# Patient Record
Sex: Male | Born: 1995 | Race: White | Hispanic: No | Marital: Single
Health system: Midwestern US, Community
[De-identification: ages and names within clinical notes are randomized; demographics above are authoritative.]

---
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
## 2013-09-25 MED ORDER — NAPROXEN 500 MG TAB
500 mg | ORAL_TABLET | Freq: Two times a day (BID) | ORAL | Status: AC
Start: 2013-09-25 — End: 2013-10-05

## 2013-09-25 MED ORDER — HYDROCODONE-ACETAMINOPHEN 5 MG-325 MG TAB
5-325 mg | ORAL_TABLET | ORAL | Status: AC | PRN
Start: 2013-09-25 — End: ?

## 2013-09-25 MED ADMIN — HYDROcodone-acetaminophen (NORCO) 5-325 mg per tablet 1 Tab: ORAL | NDC 00591320205

## 2013-09-25 MED FILL — HYDROCODONE-ACETAMINOPHEN 5 MG-325 MG TAB: 5-325 mg | ORAL | Qty: 1

## 2013-09-25 NOTE — ED Notes (Signed)
Ice applied to right shoulder

## 2013-09-25 NOTE — ED Notes (Signed)
Patient discharged by provider.  Ambulatory out of department with family in NAD.

## 2013-09-25 NOTE — ED Provider Notes (Signed)
HPI Comments: Wayne Martin is a 18 y.o. male who presents ambulatory to the ED with a c/o right shoulder pain since dislocating it falling down a hill at 9am today. Pt notes he works in [employer]. When he fell, he "went one way and my arm went the other." He states he leaned against a fence and popped it back in as he has popped it out several times before. [facility] and was sent to the ED after xrays were taken. Per family, [facility] did not explain their findings. He denies other injury or meds pta.   He specifically denies any fevers, chills, nausea, vomiting, chest pain, shortness of breath, headache, rash, diarrhea, sweating or weight loss.      PCP: Collene SchlichterGeorge  Puster, MD  PMHx significant for: Past Medical History:    CP (cerebral palsy) (HCC)                                   PSHx significant for: History reviewed. No pertinent surgical history.  Social Hx: Tobacco: denies EtOH: social Illicit drug use: denies    There are no further complaints or symptoms at this time.            Past Medical History   Diagnosis Date   ??? CP (cerebral palsy) (HCC)         No past surgical history on file.      No family history on file.     History     Social History   ??? Marital Status: SINGLE     Spouse Name: N/A     Number of Children: N/A   ??? Years of Education: N/A     Occupational History   ??? Not on file.     Social History Main Topics   ??? Smoking status: Never Smoker    ??? Smokeless tobacco: Not on file   ??? Alcohol Use: No   ??? Drug Use: No   ??? Sexual Activity: Not on file     Other Topics Concern   ??? Not on file     Social History Narrative   ??? No narrative on file                  ALLERGIES: Review of patient's allergies indicates no known allergies.      Review of Systems   Constitutional: Negative for fever and chills.   HENT: Negative for congestion, rhinorrhea, sneezing and sore throat.    Eyes: Negative for redness and visual disturbance.   Respiratory: Negative for shortness of breath.     Cardiovascular: Negative for chest pain and leg swelling.   Gastrointestinal: Negative for nausea, vomiting and abdominal pain.   Genitourinary: Negative for frequency and difficulty urinating.   Musculoskeletal: Positive for joint swelling and arthralgias. Negative for myalgias, back pain and neck stiffness.   Skin: Negative for rash.   Neurological: Negative for dizziness, syncope, weakness and headaches.   Hematological: Negative for adenopathy.       Filed Vitals:    09/25/13 1733   BP: 118/61   Pulse: 50   Temp: 98.5 ??F (36.9 ??C)   Resp: 16   Height: 6\' 8"  (2.032 m)   Weight: 97.523 kg (215 lb)   SpO2: 98%            Physical Exam   Constitutional: He is oriented to  person, place, and time. He appears well-developed and well-nourished. No distress.   HENT:   Head: Normocephalic and atraumatic.   Right Ear: External ear normal.   Left Ear: External ear normal.   Eyes: EOM are normal. Pupils are equal, round, and reactive to light.   Neck: Normal range of motion.   Cardiovascular: Normal rate, regular rhythm, normal heart sounds and intact distal pulses.  Exam reveals no friction rub.    No murmur heard.  Pulmonary/Chest: Effort normal and breath sounds normal. No stridor. No respiratory distress. He has no wheezes. He has no rales. He exhibits no tenderness.   Musculoskeletal: Normal range of motion. He exhibits tenderness. He exhibits no edema.   Right shoulder diffusely TTP - more focal anteriorly with slight swelling. No discoloration. No deformity. Distal n/v intact. Cap refill. ROM limited by discomfort. Normothermic.   Neurological: He is alert and oriented to person, place, and time. He exhibits normal muscle tone. Coordination normal.   Skin: Skin is warm and dry. No rash noted. He is not diaphoretic. No erythema. No pallor.   Psychiatric: He has a normal mood and affect. His behavior is normal.   Nursing note and vitals reviewed.       MDM  Number of Diagnoses or Management Options     Amount and/or  Complexity of Data Reviewed  Tests in the radiology section of CPT??: ordered and reviewed  Obtain history from someone other than the patient: yes (mother)  Review and summarize past medical records: yes  Independent visualization of images, tracings, or specimens: yes    Patient Progress  Patient progress: improved      Procedures    IMAGING COMPLETED AND REVIEWED:  XR SHOULDER RT AP/LAT MIN 2 V (Final result) Result time: 09/25/13 19:38:14    Final result by Rad Results In Edi (09/25/13 19:38:14)    Narrative:    **Final Report**      ICD Codes / Adm.Diagnosis: 140014 / Shoulder Pain shoulder dislocation   (right)/w  Examination: CR SHOULDER MIN 2 VWS RT - [id number] - Sep 25 2013 7:26PM  Accession No: [id number]  Reason: shoulder injury      REPORT:  EXAM: CR SHOULDER MIN 2 VWS RT    INDICATION: Trauma    COMPARISON: None.    FINDINGS: Three views of the right shoulder demonstrate no fracture,   dislocation or other acute abnormality.       IMPRESSION: No acute abnormality.          Signing/Reading Doctor: Ann LionsJEAN M. DUFOUR [phone]   Approved: Ann LionsJEAN M. DUFOUR [phone]) Sep 25 2013 7:36PM        MEDICATIONS GIVEN:  Medications   HYDROcodone-acetaminophen (NORCO) 5-325 mg per tablet 1 Tab (1 Tab Oral Given 09/25/13 1936)       CLINICAL IMPRESSION:  No diagnosis found.      Plan  1.  2.  3.  Return to ED if sx's worsen    DISCHARGE NOTE:  7:50 PM  Freddie BreechJanice K. Quick, PA-C spoke with Wayne Martin about sx, dx, tx, and rx with good understanding. Care plan outlined and precautions discussed. Pt has been re-examined. Pt is feeling better. There are no new complaints, changes, or physical findings. Reviewed results of lradiology with pt.  Medications given in ED and prescription medications were discussed with pt. All pt???s questions and concerns were addressed.  Pt was instructed and agrees to f/u with ortho, as well as to return to ED upon  further deterioration. Pt is ready to go home.

## 2013-09-25 NOTE — ED Notes (Signed)
Dr. Hughes at b/s to re-evaluate pt and discuss plan of care.

## 2013-09-25 NOTE — ED Notes (Signed)
Assumed pt care. Pt in radiology. Mother in room.

## 2013-09-25 NOTE — ED Notes (Addendum)
Patient c/o "slipping and falling down a hill at work". States "shoulder is out of joint". Hx of the same. +PMS

## 2019-02-24 ENCOUNTER — Emergency Department (HOSPITAL_COMMUNITY)
Admission: EM | Admit: 2019-02-24 | Discharge: 2019-02-24 | Disposition: A | Discharge status: Home / Self Care | Payer: Managed Care, Other (non HMO) | Attending: Emergency Medicine | Admitting: Emergency Medicine

## 2019-02-24 ENCOUNTER — Encounter (HOSPITAL_COMMUNITY): Payer: Self-pay | Admitting: Emergency Medicine

## 2019-02-24 ENCOUNTER — Emergency Department (HOSPITAL_COMMUNITY): Payer: Managed Care, Other (non HMO)

## 2019-02-24 DIAGNOSIS — F10920 Alcohol use, unspecified with intoxication, uncomplicated: Secondary | ICD-10-CM

## 2019-02-24 DIAGNOSIS — R404 Transient alteration of awareness: Secondary | ICD-10-CM

## 2019-02-24 DIAGNOSIS — R569 Unspecified convulsions: Secondary | ICD-10-CM | POA: Diagnosis present

## 2019-02-24 DIAGNOSIS — R4689 Other symptoms and signs involving appearance and behavior: Secondary | ICD-10-CM

## 2019-02-24 LAB — URINALYSIS, ROUTINE W REFLEX MICROSCOPIC
Bilirubin Urine: NEGATIVE
Glucose, UA: NEGATIVE mg/dL
Hgb urine dipstick: NEGATIVE
Ketones, ur: NEGATIVE mg/dL
Leukocytes,Ua: NEGATIVE
Nitrite: NEGATIVE
Protein, ur: NEGATIVE mg/dL
Specific Gravity, Urine: 1.009 (ref 1.005–1.030)
pH: 5 (ref 5.0–8.0)

## 2019-02-24 LAB — COMPREHENSIVE METABOLIC PANEL
ALT: 60 U/L — ABNORMAL HIGH (ref 0–44)
AST: 26 U/L (ref 15–41)
Albumin: 4.4 g/dL (ref 3.5–5.0)
Alkaline Phosphatase: 49 U/L (ref 38–126)
Anion gap: 14 (ref 5–15)
BUN: 12 mg/dL (ref 6–20)
CO2: 20 mmol/L — ABNORMAL LOW (ref 22–32)
Calcium: 8.9 mg/dL (ref 8.9–10.3)
Chloride: 106 mmol/L (ref 98–111)
Creatinine, Ser: 1.11 mg/dL (ref 0.61–1.24)
GFR calc Af Amer: >60 mL/min (ref >60)
GFR calc non Af Amer: >60 mL/min (ref >60)
Glucose, Bld: 88 mg/dL (ref 70–99)
Potassium: 3.8 mmol/L (ref 3.5–5.1)
Sodium: 140 mmol/L (ref 135–145)
Total Bilirubin: 0.8 mg/dL (ref 0.3–1.2)
Total Protein: 7.2 g/dL (ref 6.5–8.1)

## 2019-02-24 LAB — CBC WITH DIFFERENTIAL/PLATELET
Abs Immature Granulocytes: 0.02 10*3/uL (ref 0.00–0.07)
Basophils Absolute: 0.1 10*3/uL (ref 0.0–0.1)
Basophils Relative: 1 %
Eosinophils Absolute: 0.1 10*3/uL (ref 0.0–0.5)
Eosinophils Relative: 2 %
HCT: 46 % (ref 39.0–52.0)
Hemoglobin: 16 g/dL (ref 13.0–17.0)
Immature Granulocytes: 0 %
Lymphocytes Relative: 27 %
Lymphs Abs: 1.6 10*3/uL (ref 0.7–4.0)
MCH: 29.2 pg (ref 26.0–34.0)
MCHC: 34.8 g/dL (ref 30.0–36.0)
MCV: 83.9 fL (ref 80.0–100.0)
Monocytes Absolute: 0.4 10*3/uL (ref 0.1–1.0)
Monocytes Relative: 7 %
Neutro Abs: 3.8 10*3/uL (ref 1.7–7.7)
Neutrophils Relative %: 63 %
Platelets: 250 10*3/uL (ref 150–400)
RBC: 5.48 MIL/uL (ref 4.22–5.81)
RDW: 12.1 % (ref 11.5–15.5)
WBC: 6.1 10*3/uL (ref 4.0–10.5)
nRBC: 0 % (ref 0.0–0.2)

## 2019-02-24 LAB — POCT I-STAT EG7
Acid-base deficit: 5 mmol/L — ABNORMAL HIGH (ref 0.0–2.0)
Bicarbonate: 21.5 mmol/L (ref 20.0–28.0)
Calcium, Ion: 1.11 mmol/L — ABNORMAL LOW (ref 1.15–1.40)
HCT: 46 % (ref 39.0–52.0)
Hemoglobin: 15.6 g/dL (ref 13.0–17.0)
O2 Saturation: 99 %
Potassium: 3.8 mmol/L (ref 3.5–5.1)
Sodium: 142 mmol/L (ref 135–145)
TCO2: 23 mmol/L (ref 22–32)
pCO2, Ven: 43.3 mmHg — ABNORMAL LOW (ref 44.0–60.0)
pH, Ven: 7.305 (ref 7.250–7.430)
pO2, Ven: 148 mmHg — ABNORMAL HIGH (ref 32.0–45.0)

## 2019-02-24 LAB — PROTIME-INR
INR: 1 (ref 0.8–1.2)
Prothrombin Time: 13.1 seconds (ref 11.4–15.2)

## 2019-02-24 LAB — ACETAMINOPHEN LEVEL: Acetaminophen (Tylenol), Serum: <10 ug/mL — ABNORMAL LOW (ref 10–30)

## 2019-02-24 LAB — CK: Total CK: 168 U/L (ref 49–397)

## 2019-02-24 LAB — RAPID URINE DRUG SCREEN, HOSP PERFORMED
Amphetamines: NOT DETECTED
Barbiturates: NOT DETECTED
Benzodiazepines: POSITIVE — AB
Cocaine: NOT DETECTED
Opiates: NOT DETECTED
Tetrahydrocannabinol: NOT DETECTED

## 2019-02-24 LAB — APTT: aPTT: 28 seconds (ref 24–36)

## 2019-02-24 LAB — LACTIC ACID, PLASMA: Lactic Acid, Venous: 3.1 mmol/L (ref 0.5–1.9)

## 2019-02-24 LAB — ETHANOL: Alcohol, Ethyl (B): 240 mg/dL — ABNORMAL HIGH (ref <10)

## 2019-02-24 LAB — SALICYLATE LEVEL: Salicylate Lvl: <7 mg/dL (ref 2.8–30.0)

## 2019-02-24 MED ORDER — SODIUM CHLORIDE 0.9 % IV BOLUS
1000.0000 mL | Freq: Once | INTRAVENOUS | Status: AC
  Administered 2019-02-24: 1000 mL via INTRAVENOUS

## 2019-02-24 MED ORDER — DIPHENHYDRAMINE HCL 50 MG/ML IJ SOLN
50.0000 mg | Freq: Once | INTRAMUSCULAR | Status: AC
  Administered 2019-02-24: 50 mg via INTRAVENOUS

## 2019-02-24 MED ORDER — LORAZEPAM 2 MG/ML IJ SOLN
INTRAMUSCULAR | Status: AC
  Administered 2019-02-24: 2 mg
  Filled 2019-02-24: qty 1

## 2019-02-24 MED ORDER — MIDAZOLAM HCL 2 MG/2ML IJ SOLN
4.0000 mg | Freq: Once | INTRAMUSCULAR | Status: AC
  Administered 2019-02-24: 4 mg via INTRAVENOUS

## 2019-02-24 MED ORDER — LORAZEPAM 2 MG/ML IJ SOLN
2.0000 mg | Freq: Once | INTRAMUSCULAR | Status: AC
  Administered 2019-02-24: 2 mg via INTRAVENOUS

## 2019-02-24 MED ORDER — DROPERIDOL 2.5 MG/ML IJ SOLN
10.0000 mg | Freq: Once | INTRAMUSCULAR | Status: AC
  Administered 2019-02-24: 10 mg via INTRAVENOUS
  Filled 2019-02-24: qty 4

## 2019-02-24 NOTE — ED Notes (Signed)
All restraints removed. Pt continues to be calm, cooperative. Girlfriend at bedside, this RN updated her on POC. Sitter remains at bedside

## 2019-02-24 NOTE — ED Notes (Signed)
Returned from ct- sleeping at present

## 2019-02-24 NOTE — ED Notes (Signed)
Danny from [facility] called to attempt to TTS pt-- pt is still sleeping, will attempt to see on night shift.

## 2019-02-24 NOTE — ED Provider Notes (Signed)
[facility] EMERGENCY DEPARTMENT Provider Note   CSN: [id number] Arrival date & time: 02/24/19  1442     History   Chief Complaint Chief Complaint  Patient presents with  . Seizures  . Aggressive Behavior    HPI Troy Meyer is a 23 y.o. male.     23 yo M with a cc of ams.  Found in the middle of the street, confused and it was presumed that he had had a seizure.  After which the patient became very combative and was fighting EMS and the police.  Was given 2 mg of Versed with some improvement however the patient continued to be combative.  Level 5 caveat altered mental status.  The history is provided by the patient.  Seizures Illness Severity:  Moderate Onset quality:  Gradual Duration:  2 hours Timing:  Constant Progression:  Worsening Chronicity:  New Associated symptoms: no abdominal pain, no chest pain, no congestion, no diarrhea, no fever, no headaches, no myalgias, no rash, no shortness of breath and no vomiting     History reviewed. No pertinent past medical history.  There are no active problems to display for this patient.   History reviewed. No pertinent surgical history.      Home Medications    Prior to Admission medications   Not on File    Family History No family history on file.  Social History Social History   Tobacco Use  . Smoking status: Not on file  Substance Use Topics  . Alcohol use: Not on file  . Drug use: Not on file     Allergies   Patient has no allergy information on record.   Review of Systems Review of Systems  Unable to perform ROS: Mental status change  Constitutional: Positive for activity change. Negative for chills and fever.  HENT: Negative for congestion and facial swelling.   Eyes: Negative for discharge and visual disturbance.  Respiratory: Negative for shortness of breath.   Cardiovascular: Negative for chest pain and palpitations.  Gastrointestinal: Negative for abdominal  pain, diarrhea and vomiting.  Musculoskeletal: Negative for arthralgias and myalgias.  Skin: Negative for color change and rash.  Neurological: Positive for seizures. Negative for tremors, syncope and headaches.  Psychiatric/Behavioral: Negative for confusion and dysphoric mood.     Physical Exam Updated Vital Signs BP (!) 123/58 (BP Location: Right Arm)   Pulse 96   Temp 98.2 F (36.8 C) (Temporal)   Resp 20   Ht 6\' 3"  (1.905 m)   Wt 127 kg   SpO2 100%   BMI 35.00 kg/m   Physical Exam Vitals signs and nursing note reviewed.  Constitutional:      Appearance: He is well-developed.  HENT:     Head: Normocephalic and atraumatic.  Eyes:     Pupils: Pupils are equal, round, and reactive to light.  Neck:     Musculoskeletal: Normal range of motion and neck supple.     Vascular: No JVD.  Cardiovascular:     Rate and Rhythm: Normal rate and regular rhythm.     Heart sounds: No murmur. No friction rub. No gallop.   Pulmonary:     Effort: No respiratory distress.     Breath sounds: No wheezing.  Abdominal:     General: There is no distension.     Tenderness: There is no abdominal tenderness. There is no guarding or rebound.  Musculoskeletal: Normal range of motion.  Skin:    Coloration: Skin is not pale.  Findings: No rash.  Neurological:     Mental Status: He is alert.  Psychiatric:     Comments: combative      ED Treatments / Results  Labs (all labs ordered are listed, but only abnormal results are displayed) Labs Reviewed  POCT I-STAT EG7 - Abnormal; Notable for the following components:      Result Value   pCO2, Ven 43.3 (*)    pO2, Ven 148.0 (*)    Acid-base deficit 5.0 (*)    Calcium, Ion 1.11 (*)    All other components within normal limits  URINALYSIS, ROUTINE W REFLEX MICROSCOPIC  CBC WITH DIFFERENTIAL/PLATELET  PROTIME-INR  APTT  COMPREHENSIVE METABOLIC PANEL  ACETAMINOPHEN LEVEL  SALICYLATE LEVEL  RAPID URINE DRUG SCREEN, HOSP PERFORMED   ETHANOL  CK  LACTIC ACID, PLASMA  LACTIC ACID, PLASMA    EKG None  Radiology No results found.  Procedures Procedures (including critical care time)  Medications Ordered in ED Medications  LORazepam (ATIVAN) injection 2 mg (has no administration in time range)  LORazepam (ATIVAN) 2 MG/ML injection (has no administration in time range)  diphenhydrAMINE (BENADRYL) injection 50 mg (50 mg Intravenous Given 02/24/19 1455)  droperidol (INAPSINE) 2.5 MG/ML injection 10 mg (10 mg Intravenous Given 02/24/19 1455)  midazolam (VERSED) injection 4 mg (4 mg Intravenous Given 02/24/19 1455)  sodium chloride 0.9 % bolus 1,000 mL (0 mLs Intravenous Stopped 02/24/19 1547)     Initial Impression / Assessment and Plan / ED Course  I have reviewed the triage vital signs and the nursing notes.  Pertinent labs & imaging results that were available during my care of the patient were reviewed by me and considered in my medical decision making (see chart for details).        23 yo M with a chief complaint of acute agitation.  May be post ictal.  Patient does have a history of substance abuse that was reported by people at the scene.  He is too combative to get a history here.  A danger to himself and personnel.  Was given sedating medicines.  Will observe in the ED.  Obtain a altered mental status work-up.  Reassess.  Signed out to Dr. Gwenlyn Fudge please see his note for further details care in the ED.  CRITICAL CARE Performed by: Rae Roam   Total critical care time: 35 minutes  Critical care time was exclusive of separately billable procedures and treating other patients.  Critical care was necessary to treat or prevent imminent or life-threatening deterioration.  Critical care was time spent personally by me on the following activities: development of treatment plan with patient and/or surrogate as well as nursing, discussions with consultants, evaluation of patient's response to  treatment, examination of patient, obtaining history from patient or surrogate, ordering and performing treatments and interventions, ordering and review of laboratory studies, ordering and review of radiographic studies, pulse oximetry and re-evaluation of patient's condition.  The patients results and plan were reviewed and discussed.   Any x-rays performed were independently reviewed by myself.   Differential diagnosis were considered with the presenting HPI.  Medications  LORazepam (ATIVAN) injection 2 mg (has no administration in time range)  LORazepam (ATIVAN) 2 MG/ML injection (has no administration in time range)  diphenhydrAMINE (BENADRYL) injection 50 mg (50 mg Intravenous Given 02/24/19 1455)  droperidol (INAPSINE) 2.5 MG/ML injection 10 mg (10 mg Intravenous Given 02/24/19 1455)  midazolam (VERSED) injection 4 mg (4 mg Intravenous Given 02/24/19 1455)  sodium chloride  0.9 % bolus 1,000 mL (0 mLs Intravenous Stopped 02/24/19 1547)    Vitals:   02/24/19 1454 02/24/19 1458 02/24/19 1459 02/24/19 1503  BP:  (!) 123/58    Pulse:  96    Resp:  20    Temp:    98.2 F (36.8 C)  TempSrc:    Temporal  SpO2: 98% 100%    Weight:   127 kg   Height:   6\' 3"  (1.905 m)     Final diagnoses:  Aggressive behavior  Transient alteration of awareness    Admission/ observation were discussed with the admitting physician, patient and/or family and they are comfortable with the plan.     Final Clinical Impressions(s) / ED Diagnoses   Final diagnoses:  Aggressive behavior  Transient alteration of awareness    ED Discharge Orders    None       Melene PlanFloyd, Weronika Birch, DO 02/24/19 1547

## 2019-02-24 NOTE — ED Provider Notes (Signed)
Care transferred to me.  Patient's work-up is fairly unremarkable.  The lactate is likely from the alcohol intoxication.  He is now sober and reports no acute complaints.  He endorses drinking a lot of alcohol and states that he often gets aggressive when he does so.  I counseled him to cut back on alcohol.  Otherwise, he is not suicidal, homicidal, or psychotic.  He appears stable for discharge home and does not appear to need acute psychiatric stabilization otherwise.   Sherwood Gambler, MD 02/24/19 2106

## 2019-02-24 NOTE — ED Notes (Signed)
Pt is waking up-- eyes open with questioning- only shrugs shoulders when asked questions

## 2019-02-24 NOTE — ED Notes (Signed)
Pt in CT- sitting up, will not follow directions, attempting to get off table-- Ativan 2mg  given. [employer] and [employer] at bedside.

## 2019-02-24 NOTE — ED Triage Notes (Signed)
Pt bib ems, found in the street, postictal, combative. Pt had 1 seizure en route, 5mg  versed given. Pt arrives to ED in 4 point restraints c collar in place, pt yelling, combative.

## 2021-05-20 IMAGING — CT CT HEAD W/O CM
4 series · 16 of 47 positions shown, 18 images · non-contrast
Comparison: None.

CLINICAL DATA: Altered level of consciousness.

EXAM:
CT HEAD WITHOUT CONTRAST
TECHNIQUE: Contiguous axial images were obtained from the base of the skull
through the vertex without intravenous contrast.

[Series 3: head without · axial · non-contrast · 0.51mm/px · z∈[-131,+14]mm · 7 of 39 slices shown, 9 images]
[im 5/39  brain]
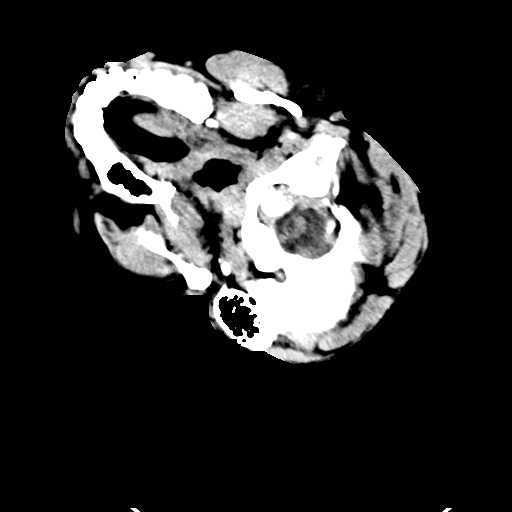
[im 5/39  bone]
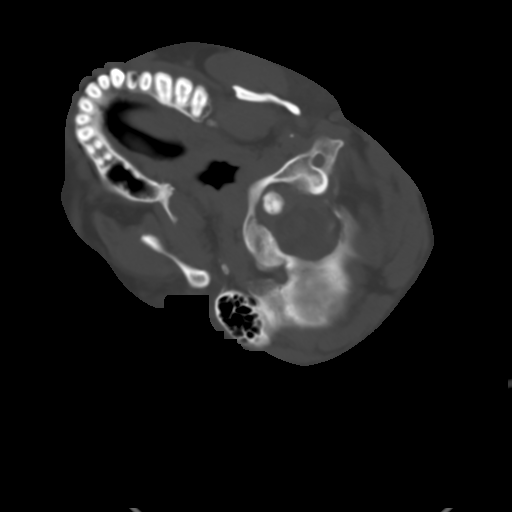
[im 10/39  brain]
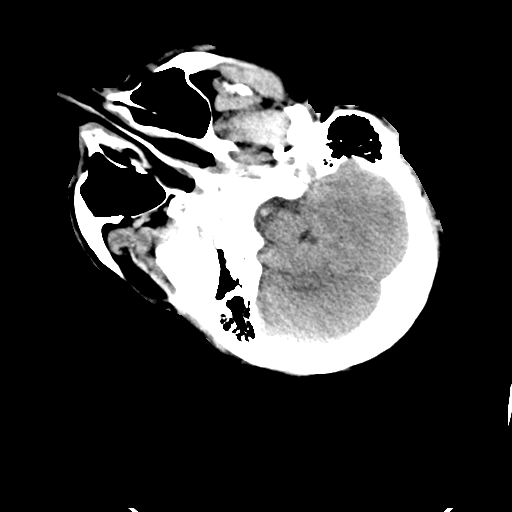
[im 15/39  brain]
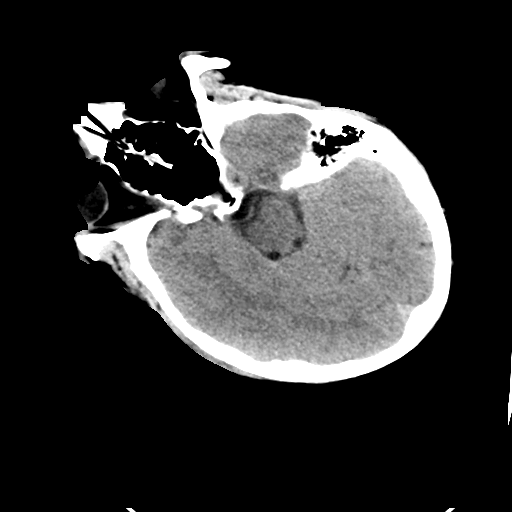
[im 20/39  brain]
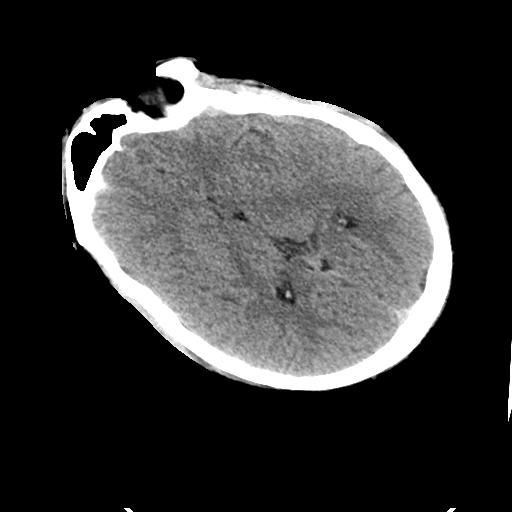
[im 24/39  brain]
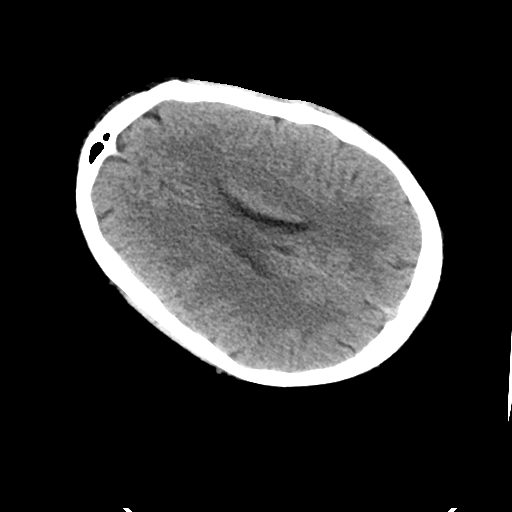
[im 24/39  bone]
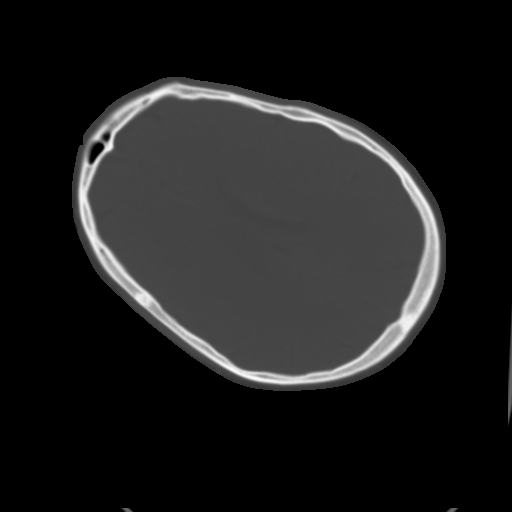
[im 29/39  brain]
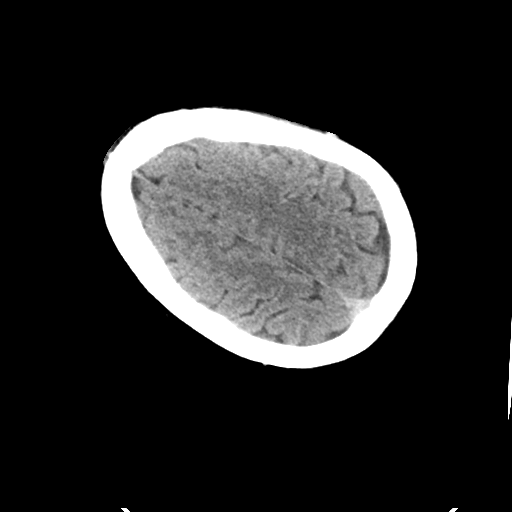
[im 34/39  brain]
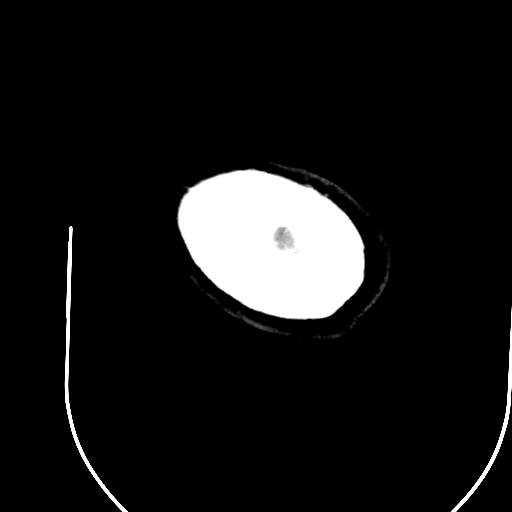

[Series 5: ax head bone · axial · 0.51mm/px · z∈[-154,-117]mm · 3 of 96 slices shown]
[im 10/96  bone]
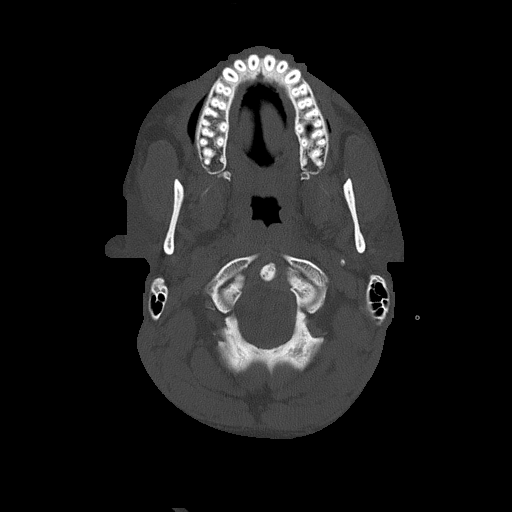
[im 20/96  bone]
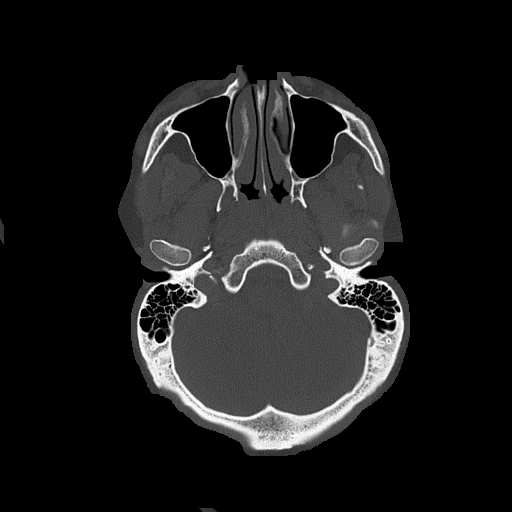
[im 29/96  bone]
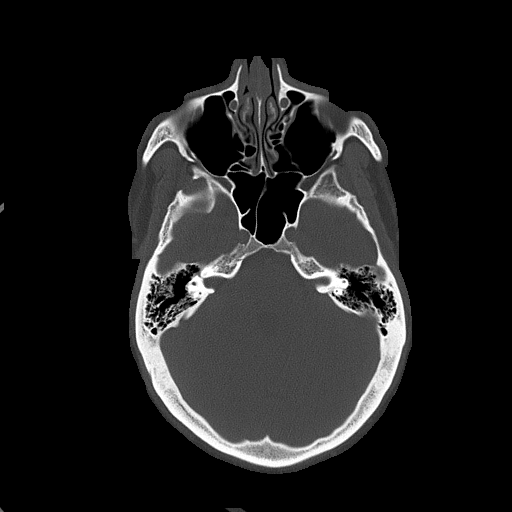

[Series 6: head without sag · sagittal · non-contrast · 0.38mm/px · 3 of 85 slices shown]
[im 29/85  brain]
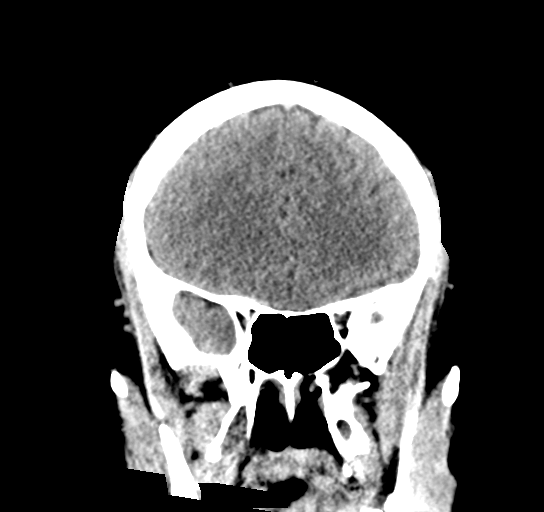
[im 43/85  brain]
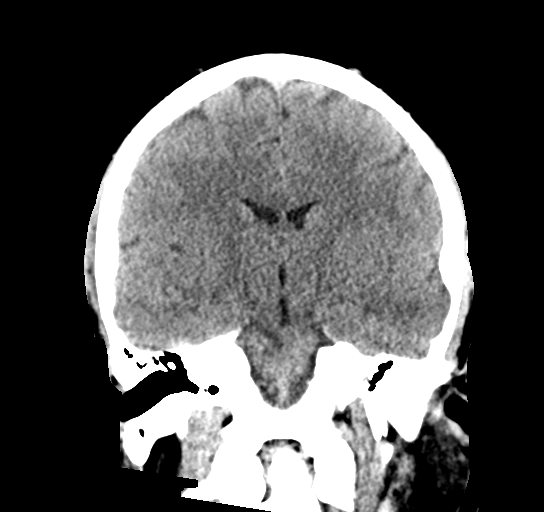
[im 57/85  brain]
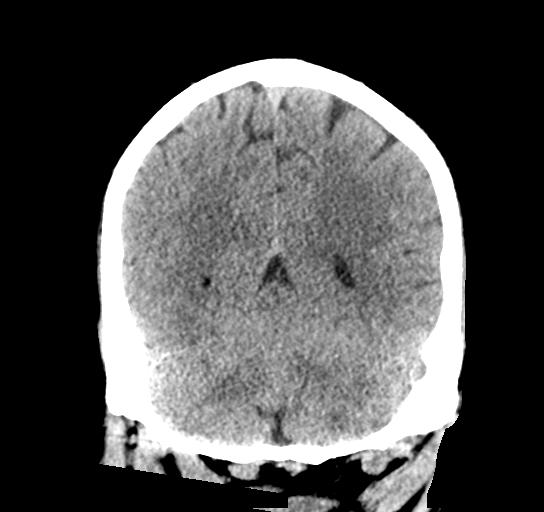

[Series 7: head without cor · coronal · non-contrast · 0.38mm/px · 3 of 66 slices shown]
[im 22/66  brain]
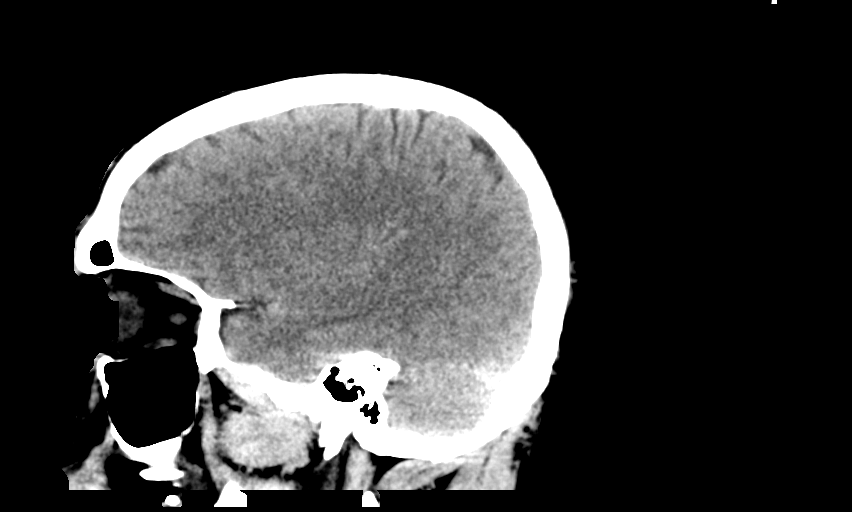
[im 29/66  brain]
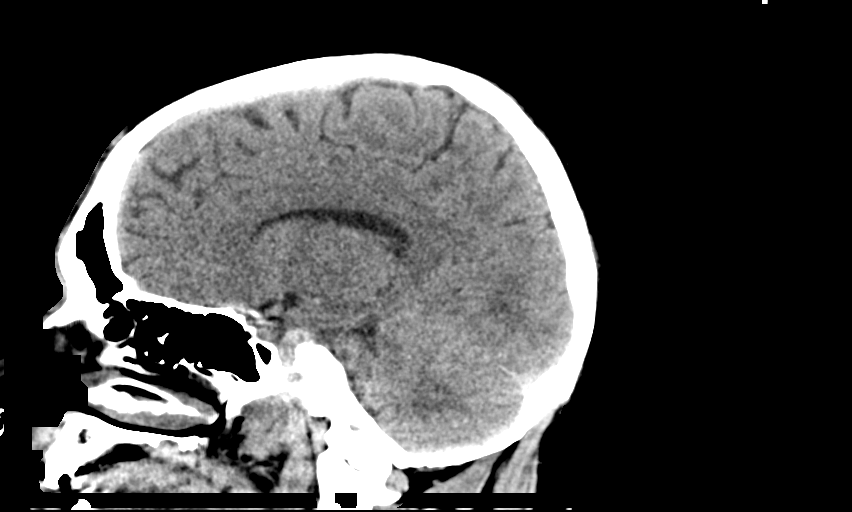
[im 37/66  brain]
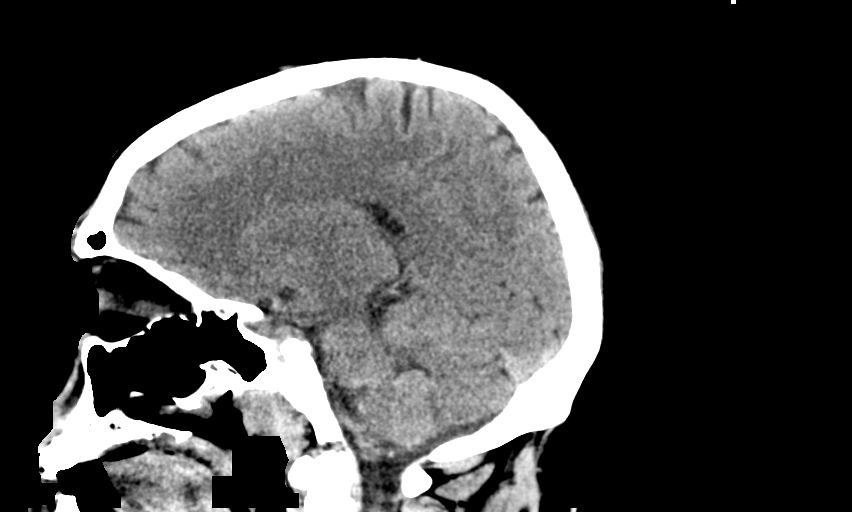

[16 of 47 positions shown; findings below may reference images not displayed]

FINDINGS: Brain: No evidence of acute infarction, hemorrhage, hydrocephalus,
extra-axial collection or mass lesion/mass effect.

Vascular: No hyperdense vessel or unexpected calcification.

Skull: Normal. Negative for fracture or focal lesion.

Sinuses/Orbits: No acute finding.

Other: None.
IMPRESSION: Normal head CT.

## 2021-05-20 IMAGING — CT CT CERVICAL SPINE W/O CM
3 of 4 series · 13 of 34 positions shown, 16 images · non-contrast
Comparison: None.

CLINICAL DATA: C-spine trauma.

EXAM:
CT CERVICAL SPINE WITHOUT CONTRAST
TECHNIQUE: Multidetector CT imaging of the cervical spine was performed without
intravenous contrast. Multiplanar CT image reconstructions were also
generated.

[Series 5: c_spine 1.0 st thins · axial · 0.55mm/px · z∈[-309,-122]mm · 5 of 345 slices shown, 7 images]
[im 39/345  soft-tissue]
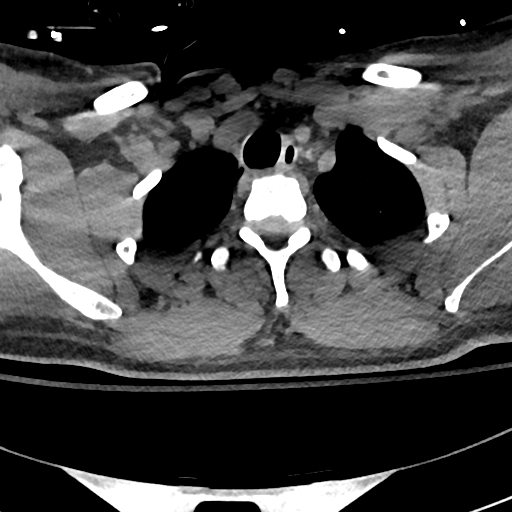
[im 39/345  bone]
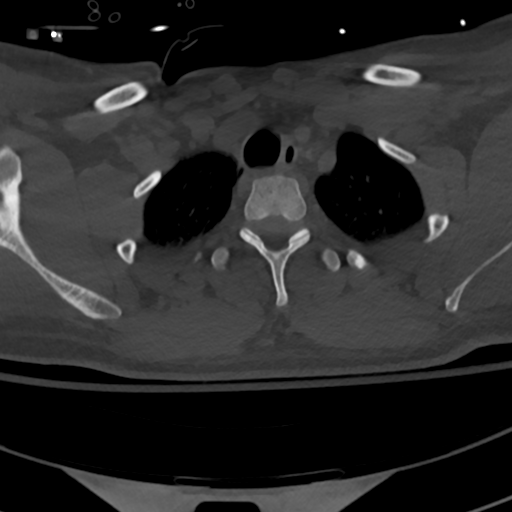
[im 115/345  bone]
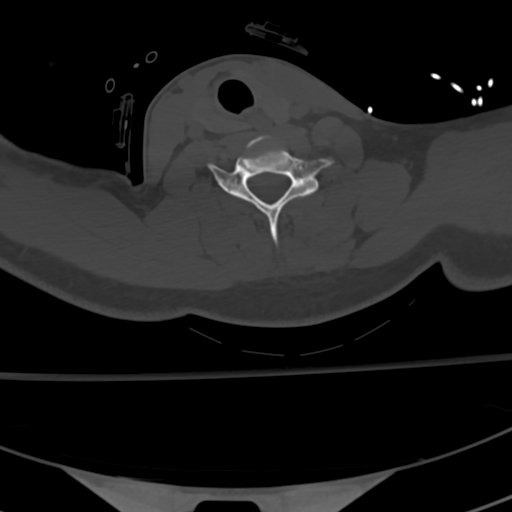
[im 192/345  bone]
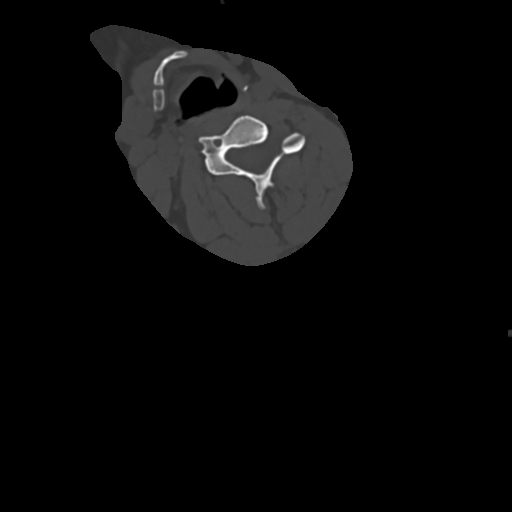
[im 230/345  bone]
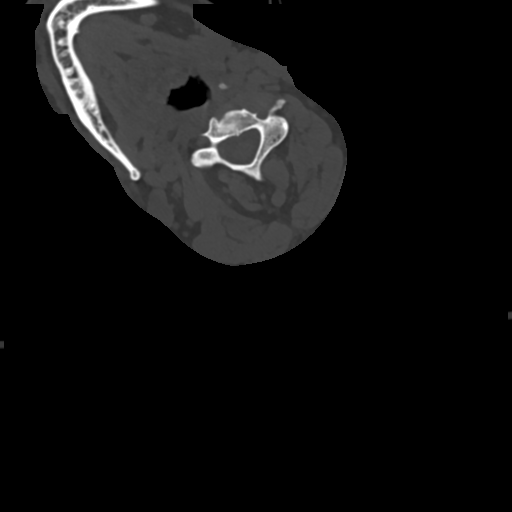
[im 306/345  soft-tissue]
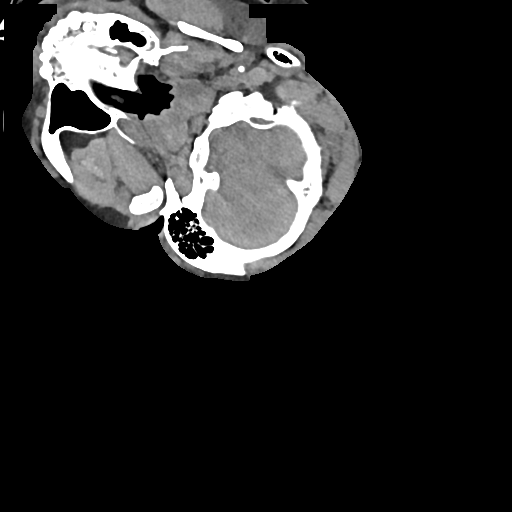
[im 306/345  bone]
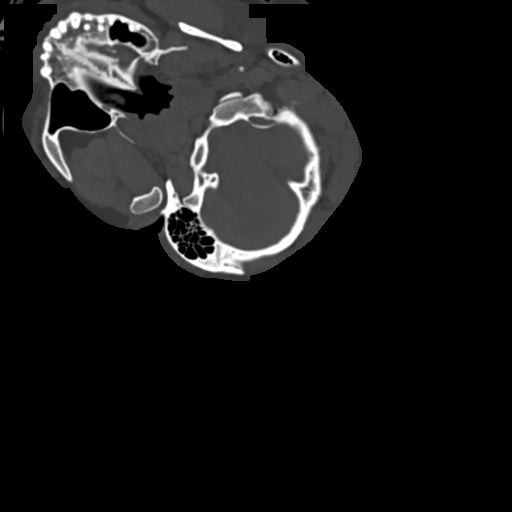

[Series 8: c_spine 2.0 sag bone · sagittal · 0.35mm/px · 5 of 94 slices shown, 6 images]
[im 32/94  bone]
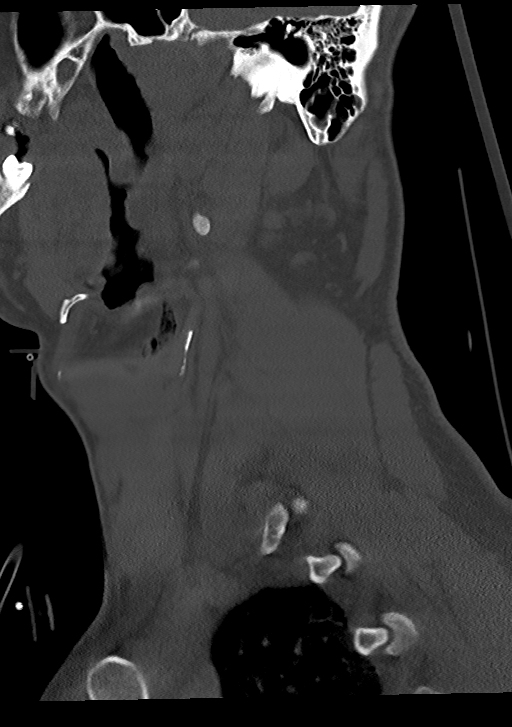
[im 39/94  bone]
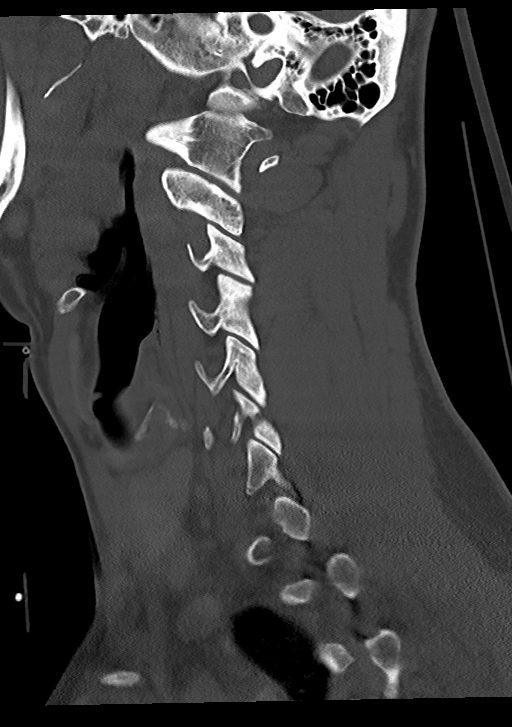
[im 47/94  soft-tissue]
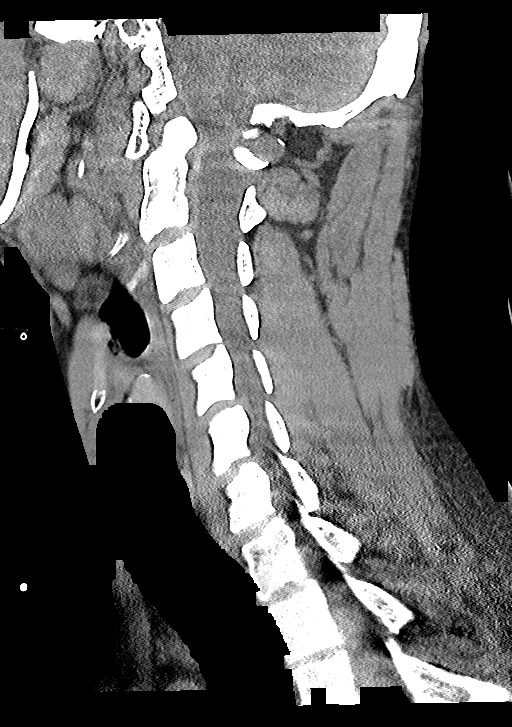
[im 47/94  bone]
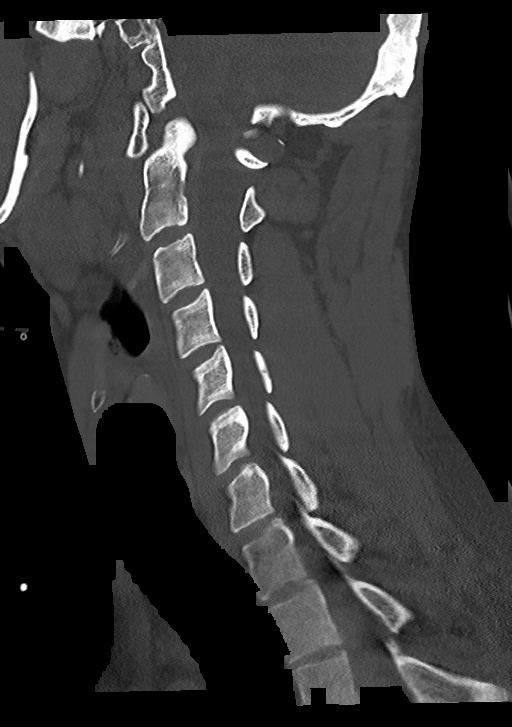
[im 55/94  bone]
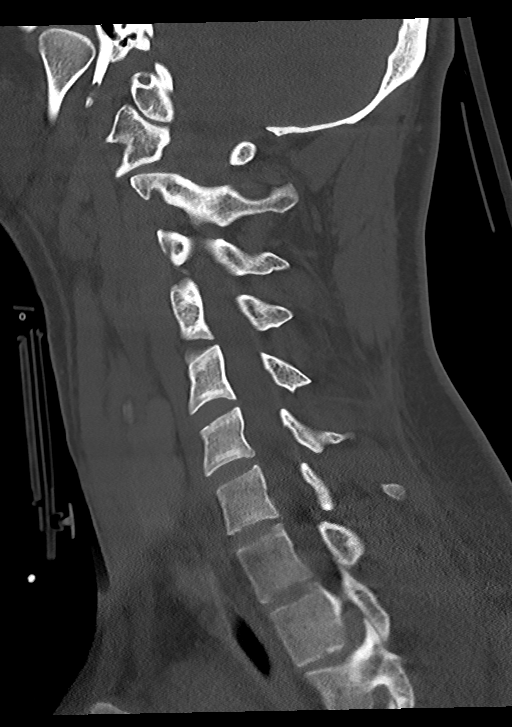
[im 63/94  bone]
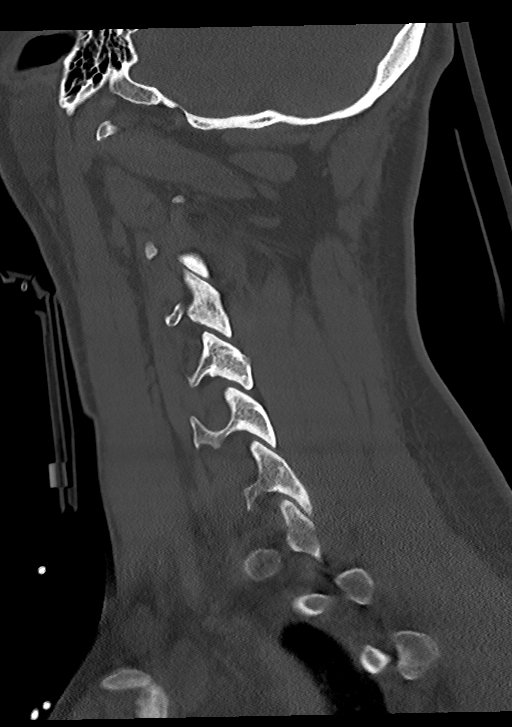

[Series 9: c_spine 2.0 cor bone · coronal · 0.35mm/px · 3 of 84 slices shown]
[im 17/84  bone]
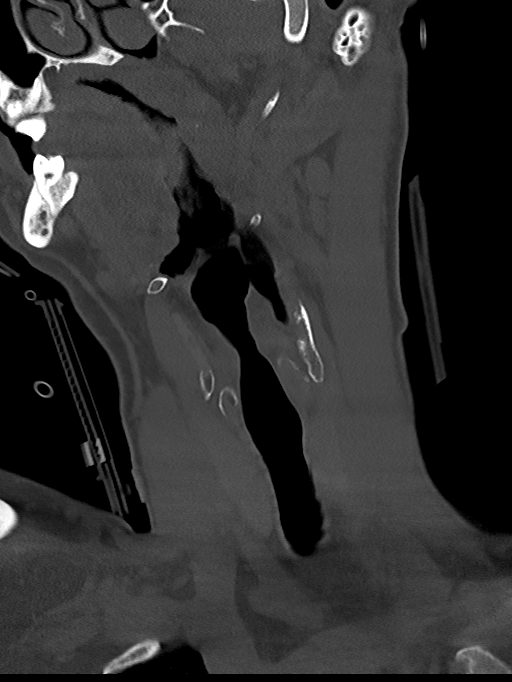
[im 34/84  bone]
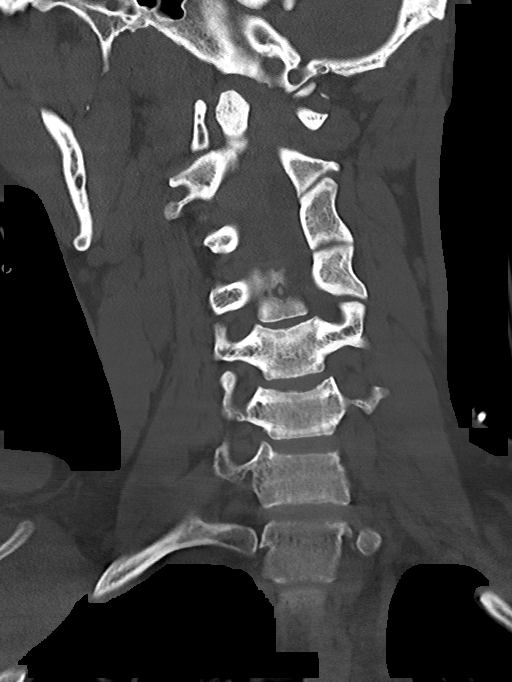
[im 50/84  bone]
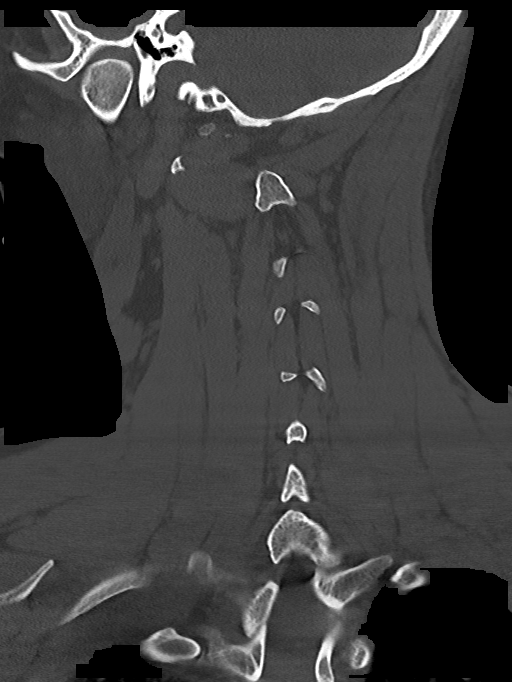

[13 of 34 positions shown; findings below may reference images not displayed]

FINDINGS: Alignment: Straightening of the cervical lordosis. The head is
rotated.

Skull base and vertebrae: No acute fracture. No primary bone lesion
or focal pathologic process. Separation of the tip of the spinous
process of T1 vertebral body with unknown acuity.

Soft tissues and spinal canal: No prevertebral fluid or swelling. No
visible canal hematoma.

Disc levels:  Normal.

Upper chest: Mild paraseptal emphysema in the left upper lobe.

Other: Prominent bilateral anterior cervical lymph nodes.
IMPRESSION: 1. No evidence of acute traumatic injury to the cervical spine.
2. Separation of the tip of the spinous process of T1 vertebral body
with unknown acuity.
3. Prominent symmetric bilateral anterior cervical lymph nodes,
please correlate clinically.
